# Patient Record
Sex: Female | Born: 1965
Health system: Southern US, Community
[De-identification: ages and names within clinical notes are randomized; demographics above are authoritative.]

## PROBLEM LIST (undated history)

## (undated) DIAGNOSIS — T7840XA Allergy, unspecified, initial encounter: Secondary | ICD-10-CM

## (undated) DIAGNOSIS — Z9889 Other specified postprocedural states: Secondary | ICD-10-CM

## (undated) DIAGNOSIS — D649 Anemia, unspecified: Secondary | ICD-10-CM

## (undated) DIAGNOSIS — J45909 Unspecified asthma, uncomplicated: Secondary | ICD-10-CM

## (undated) DIAGNOSIS — K219 Gastro-esophageal reflux disease without esophagitis: Secondary | ICD-10-CM

## (undated) DIAGNOSIS — M1990 Unspecified osteoarthritis, unspecified site: Secondary | ICD-10-CM

## (undated) DIAGNOSIS — I10 Essential (primary) hypertension: Secondary | ICD-10-CM

## (undated) DIAGNOSIS — H409 Unspecified glaucoma: Secondary | ICD-10-CM

## (undated) DIAGNOSIS — F419 Anxiety disorder, unspecified: Secondary | ICD-10-CM

## (undated) DIAGNOSIS — R112 Nausea with vomiting, unspecified: Secondary | ICD-10-CM

## (undated) HISTORY — DX: Unspecified osteoarthritis, unspecified site: M19.90

## (undated) HISTORY — DX: Anxiety disorder, unspecified: F41.9

## (undated) HISTORY — DX: Unspecified glaucoma: H40.9

## (undated) HISTORY — DX: Allergy, unspecified, initial encounter: T78.40XA

---

## 1990-05-13 HISTORY — PX: NASAL SINUS SURGERY: SHX719

## 1999-07-17 ENCOUNTER — Other Ambulatory Visit: Admission: RE | Admit: 1999-07-17 | Discharge: 1999-07-17 | Payer: Self-pay | Admitting: Unknown Physician Specialty

## 2001-03-19 ENCOUNTER — Ambulatory Visit (HOSPITAL_COMMUNITY): Admission: RE | Admit: 2001-03-19 | Discharge: 2001-03-19 | Payer: Self-pay | Admitting: *Deleted

## 2001-03-19 ENCOUNTER — Encounter: Payer: Self-pay | Admitting: Allergy and Immunology

## 2007-07-10 ENCOUNTER — Emergency Department (HOSPITAL_COMMUNITY): Admission: EM | Admit: 2007-07-10 | Discharge: 2007-07-10 | Payer: Self-pay | Admitting: Family Medicine

## 2007-08-03 ENCOUNTER — Encounter: Admission: RE | Admit: 2007-08-03 | Discharge: 2007-08-03 | Payer: Self-pay | Admitting: Obstetrics and Gynecology

## 2007-09-30 ENCOUNTER — Emergency Department (HOSPITAL_COMMUNITY): Admission: EM | Admit: 2007-09-30 | Discharge: 2007-09-30 | Payer: Self-pay | Admitting: Family Medicine

## 2008-05-13 HISTORY — PX: LIPOMA EXCISION: SHX5283

## 2008-11-07 ENCOUNTER — Encounter: Admission: RE | Admit: 2008-11-07 | Discharge: 2008-11-07 | Payer: Self-pay | Admitting: Obstetrics and Gynecology

## 2011-05-14 HISTORY — PX: CHOLECYSTECTOMY: SHX55

## 2012-05-19 ENCOUNTER — Encounter (HOSPITAL_COMMUNITY): Payer: Self-pay

## 2012-05-19 ENCOUNTER — Other Ambulatory Visit (HOSPITAL_COMMUNITY): Payer: Self-pay | Admitting: Otolaryngology

## 2012-05-19 DIAGNOSIS — R221 Localized swelling, mass and lump, neck: Secondary | ICD-10-CM

## 2012-05-20 ENCOUNTER — Encounter (HOSPITAL_COMMUNITY): Payer: Self-pay | Admitting: Pharmacy Technician

## 2012-05-20 ENCOUNTER — Other Ambulatory Visit: Payer: Self-pay | Admitting: Radiology

## 2012-05-21 ENCOUNTER — Encounter (HOSPITAL_COMMUNITY): Payer: Self-pay

## 2012-05-21 ENCOUNTER — Ambulatory Visit (HOSPITAL_COMMUNITY)
Admission: RE | Admit: 2012-05-21 | Discharge: 2012-05-21 | Disposition: A | Discharge status: Home / Self Care | Payer: Managed Care, Other (non HMO) | Source: Ambulatory Visit | Attending: Unknown Physician Specialty | Admitting: Unknown Physician Specialty

## 2012-05-21 DIAGNOSIS — R221 Localized swelling, mass and lump, neck: Secondary | ICD-10-CM

## 2012-05-21 DIAGNOSIS — R599 Enlarged lymph nodes, unspecified: Secondary | ICD-10-CM | POA: Insufficient documentation

## 2012-05-21 HISTORY — DX: Gastro-esophageal reflux disease without esophagitis: K21.9

## 2012-05-21 HISTORY — DX: Other specified postprocedural states: R11.2

## 2012-05-21 HISTORY — DX: Other specified postprocedural states: Z98.890

## 2012-05-21 HISTORY — DX: Unspecified asthma, uncomplicated: J45.909

## 2012-05-21 HISTORY — DX: Anemia, unspecified: D64.9

## 2012-05-21 LAB — CBC
HCT: 36.2 % (ref 36.0–46.0)
MCV: 77.8 fL — ABNORMAL LOW (ref 78.0–100.0)
RBC: 4.65 MIL/uL (ref 3.87–5.11)
RDW: 15.2 % (ref 11.5–15.5)
WBC: 5.8 10*3/uL (ref 4.0–10.5)

## 2012-05-21 MED ORDER — MIDAZOLAM HCL 2 MG/2ML IJ SOLN
INTRAMUSCULAR | Status: AC | PRN
  Administered 2012-05-21 (×2): 1 mg via INTRAVENOUS
  Administered 2012-05-21: 2 mg via INTRAVENOUS

## 2012-05-21 MED ORDER — MIDAZOLAM HCL 2 MG/2ML IJ SOLN
INTRAMUSCULAR | Status: AC
  Filled 2012-05-21: qty 6

## 2012-05-21 MED ORDER — ONDANSETRON HCL 4 MG/2ML IJ SOLN
INTRAMUSCULAR | Status: AC
  Filled 2012-05-21: qty 2

## 2012-05-21 MED ORDER — SODIUM CHLORIDE 0.9 % IV SOLN
INTRAVENOUS | Status: DC
  Administered 2012-05-21: 12:00:00 via INTRAVENOUS

## 2012-05-21 MED ORDER — FENTANYL CITRATE 0.05 MG/ML IJ SOLN
INTRAMUSCULAR | Status: AC | PRN
  Administered 2012-05-21 (×2): 100 ug via INTRAVENOUS

## 2012-05-21 MED ORDER — ONDANSETRON HCL 4 MG/2ML IJ SOLN
4.0000 mg | Freq: Once | INTRAMUSCULAR | Status: DC
  Filled 2012-05-21: qty 2

## 2012-05-21 MED ORDER — FENTANYL CITRATE 0.05 MG/ML IJ SOLN
INTRAMUSCULAR | Status: AC
  Filled 2012-05-21: qty 6

## 2012-05-21 MED ORDER — ONDANSETRON HCL 4 MG/2ML IJ SOLN
INTRAMUSCULAR | Status: AC | PRN
  Administered 2012-05-21: 4 mg via INTRAVENOUS

## 2012-05-21 NOTE — H&P (Signed)
Chief Complaint: "I'm here for a biopsy" Referring Physician:Dr. Ma HPI: Rebecca Price is an 47 y.o. female who was found to have an enlarged mass/node in her right neck that began about 5 weeks ago. She has been worked up by ENT who has requested US guided biopsy of this mass/node for tissue diagnosis. PMHx and meds reviewed.  Past Medical History:  Past Medical History  Diagnosis Date  . GERD (gastroesophageal reflux disease)   . Anemia     takes B 12 injection  . Asthma     allergy induced asthma  . PONV (postoperative nausea and vomiting)     Past Surgical History:  Past Surgical History  Procedure Date  . Nasal sinus surgery 1992  . Lipoma excision 2010    left arm  . Cholecystectomy 2013    Family History: No family history on file.  Social History:  reports that she quit smoking about 24 years ago. She does not have any smokeless tobacco history on file. She reports that she does not drink alcohol or use illicit drugs.  Allergies:  Allergies  Allergen Reactions  . Tape Other (See Comments)    transpore tape pulls my skin off. Bandaides makes my skin red where sticky part touches me.  . Singulair (Montelukast) Palpitations and Other (See Comments)    Irregular heart beats, palpitations      Medications: acyclovir (ZOVIRAX) 400 MG tablet (Taking) Sig - Route: Take 400 mg by mouth daily as needed. For fever blister - Oral Class: Historical Med aspirin-acetaminophen-caffeine (EXCEDRIN MIGRAINE) 250-250-65 MG per tablet (Taking) Sig - Route: Take 1 tablet by mouth every 6 (six) hours as needed. For headaches - Oral Class: Historical Med cyanocobalamin (,VITAMIN B-12,) 1000 MCG/ML injection (Taking) Sig - Route: Inject 1,000 mcg into the muscle every 30 (thirty) days. - Intramuscular Class: Historical Med latanoprost (XALATAN) 0.005 % ophthalmic solution (Taking) Sig - Route: Place 1 drop into both eyes at bedtime. - Both Eyes Class: Historical Med omeprazole (PRILOSEC) 20  MG capsule (Taking) Sig - Route: Take 20 mg by mouth daily   Please HPI for pertinent positives, otherwise complete 10 system ROS negative.  Physical Exam: Blood pressure 134/92, pulse 83, temperature 98.5 F (36.9 C), temperature source Oral, resp. rate 18, height 5\' 6"  (1.676 m), weight 212 lb (96.163 kg), last menstrual period 05/16/2012, SpO2 100.00%. Body mass index is 34.22 kg/(m^2).   General Appearance:  Alert, cooperative, no distress, appears stated age  Head:  Normocephalic, without obvious abnormality, atraumatic  ENT: Unremarkable  Neck: Supple, symmetrical, trachea midline. Palpable NT mass(es) right submental/submandibular area, firm, nonmobile.  Lungs:   Clear to auscultation bilaterally, no w/r/r, respirations unlabored without use of accessory muscles.  Chest Wall:  No tenderness or deformity  Heart:  Regular rate and rhythm, S1, S2 normal, no murmur, rub or gallop. Carotids 2+ without bruit.  Abdomen:   Soft, non-tender, non distended. Bowel sounds active all four quadrants,  no masses, no organomegaly.  Neurologic: Normal affect, no gross deficits.   Results for orders placed during the hospital encounter of 05/21/12 (from the past 48 hour(s))  APTT     Status: Normal   Collection Time   05/21/12 11:13 AM      Component Value Range Comment   aPTT 28  24 - 37 seconds   CBC     Status: Abnormal   Collection Time   05/21/12 11:13 AM      Component Value Range Comment  WBC 5.8  4.0 - 10.5 K/uL    RBC 4.65  3.87 - 5.11 MIL/uL    Hemoglobin 11.8 (*) 12.0 - 15.0 g/dL    HCT 16.1  09.6 - 04.5 %    MCV 77.8 (*) 78.0 - 100.0 fL    MCH 25.4 (*) 26.0 - 34.0 pg    MCHC 32.6  30.0 - 36.0 g/dL    RDW 40.9  81.1 - 91.4 %    Platelets 223  150 - 400 K/uL   PROTIME-INR     Status: Normal   Collection Time   05/21/12 11:13 AM      Component Value Range Comment   Prothrombin Time 12.9  11.6 - 15.2 seconds    INR 0.98  0.00 - 1.49    No results found.  Assessment/Plan (R)  neck masses/nodes of uncertain etiology Discussed US guided biopsy today Explained procedure, risks, complications, sedation use/risks. Labs reviewed, ok Consent signed in chart  Brayton El PA-C 05/21/2012, 12:08 PM

## 2012-05-21 NOTE — H&P (Signed)
Agree 

## 2012-05-21 NOTE — Procedures (Signed)
Procedure:  Ultrasound guided core biopsy of right submandibular lymph node Findings:  18 G core bx x 6 of right submandibular lymph node

## 2012-05-26 ENCOUNTER — Other Ambulatory Visit (HOSPITAL_COMMUNITY): Payer: Self-pay

## 2013-01-21 ENCOUNTER — Other Ambulatory Visit: Payer: Self-pay | Admitting: Internal Medicine

## 2013-01-21 DIAGNOSIS — E041 Nontoxic single thyroid nodule: Secondary | ICD-10-CM

## 2013-01-28 ENCOUNTER — Ambulatory Visit
Admission: RE | Admit: 2013-01-28 | Discharge: 2013-01-28 | Disposition: A | Discharge status: Home / Self Care | Payer: Managed Care, Other (non HMO) | Source: Ambulatory Visit | Attending: Internal Medicine | Admitting: Internal Medicine

## 2013-01-28 ENCOUNTER — Other Ambulatory Visit (HOSPITAL_COMMUNITY)
Admission: RE | Admit: 2013-01-28 | Discharge: 2013-01-28 | Disposition: A | Discharge status: Home / Self Care | Payer: Managed Care, Other (non HMO) | Source: Ambulatory Visit | Attending: Interventional Radiology | Admitting: Interventional Radiology

## 2013-01-28 DIAGNOSIS — E041 Nontoxic single thyroid nodule: Secondary | ICD-10-CM

## 2013-02-03 ENCOUNTER — Other Ambulatory Visit: Payer: Managed Care, Other (non HMO)

## 2016-05-20 DIAGNOSIS — R109 Unspecified abdominal pain: Secondary | ICD-10-CM | POA: Diagnosis not present

## 2016-05-20 DIAGNOSIS — N390 Urinary tract infection, site not specified: Secondary | ICD-10-CM | POA: Diagnosis not present

## 2016-09-18 DIAGNOSIS — R079 Chest pain, unspecified: Secondary | ICD-10-CM | POA: Diagnosis not present

## 2016-10-24 DIAGNOSIS — N926 Irregular menstruation, unspecified: Secondary | ICD-10-CM | POA: Diagnosis not present

## 2016-10-24 DIAGNOSIS — Z79899 Other long term (current) drug therapy: Secondary | ICD-10-CM | POA: Diagnosis not present

## 2016-10-24 DIAGNOSIS — R232 Flushing: Secondary | ICD-10-CM | POA: Diagnosis not present

## 2016-10-24 DIAGNOSIS — R030 Elevated blood-pressure reading, without diagnosis of hypertension: Secondary | ICD-10-CM | POA: Diagnosis not present

## 2016-10-24 DIAGNOSIS — D509 Iron deficiency anemia, unspecified: Secondary | ICD-10-CM | POA: Diagnosis not present

## 2016-11-15 DIAGNOSIS — J45909 Unspecified asthma, uncomplicated: Secondary | ICD-10-CM | POA: Diagnosis not present

## 2016-11-15 DIAGNOSIS — R21 Rash and other nonspecific skin eruption: Secondary | ICD-10-CM | POA: Diagnosis not present

## 2016-11-15 DIAGNOSIS — R509 Fever, unspecified: Secondary | ICD-10-CM | POA: Diagnosis not present

## 2017-02-04 DIAGNOSIS — L578 Other skin changes due to chronic exposure to nonionizing radiation: Secondary | ICD-10-CM | POA: Diagnosis not present

## 2017-02-04 DIAGNOSIS — D225 Melanocytic nevi of trunk: Secondary | ICD-10-CM | POA: Diagnosis not present

## 2017-02-04 DIAGNOSIS — D485 Neoplasm of uncertain behavior of skin: Secondary | ICD-10-CM | POA: Diagnosis not present

## 2017-02-04 DIAGNOSIS — L820 Inflamed seborrheic keratosis: Secondary | ICD-10-CM | POA: Diagnosis not present

## 2017-02-10 DIAGNOSIS — H47233 Glaucomatous optic atrophy, bilateral: Secondary | ICD-10-CM | POA: Diagnosis not present

## 2017-02-10 DIAGNOSIS — H401131 Primary open-angle glaucoma, bilateral, mild stage: Secondary | ICD-10-CM | POA: Diagnosis not present

## 2017-03-18 DIAGNOSIS — M549 Dorsalgia, unspecified: Secondary | ICD-10-CM | POA: Diagnosis not present

## 2017-03-18 DIAGNOSIS — I10 Essential (primary) hypertension: Secondary | ICD-10-CM | POA: Diagnosis not present

## 2017-03-18 DIAGNOSIS — G43009 Migraine without aura, not intractable, without status migrainosus: Secondary | ICD-10-CM | POA: Diagnosis not present

## 2017-03-24 DIAGNOSIS — M549 Dorsalgia, unspecified: Secondary | ICD-10-CM | POA: Diagnosis not present

## 2017-03-24 DIAGNOSIS — M545 Low back pain: Secondary | ICD-10-CM | POA: Diagnosis not present

## 2017-04-22 DIAGNOSIS — E559 Vitamin D deficiency, unspecified: Secondary | ICD-10-CM | POA: Diagnosis not present

## 2017-04-22 DIAGNOSIS — K219 Gastro-esophageal reflux disease without esophagitis: Secondary | ICD-10-CM | POA: Diagnosis not present

## 2017-04-22 DIAGNOSIS — I10 Essential (primary) hypertension: Secondary | ICD-10-CM | POA: Diagnosis not present

## 2017-04-22 DIAGNOSIS — D509 Iron deficiency anemia, unspecified: Secondary | ICD-10-CM | POA: Diagnosis not present

## 2017-05-27 DIAGNOSIS — R062 Wheezing: Secondary | ICD-10-CM | POA: Diagnosis not present

## 2017-05-27 DIAGNOSIS — R05 Cough: Secondary | ICD-10-CM | POA: Diagnosis not present

## 2017-05-27 DIAGNOSIS — R198 Other specified symptoms and signs involving the digestive system and abdomen: Secondary | ICD-10-CM | POA: Diagnosis not present

## 2017-06-03 DIAGNOSIS — H47233 Glaucomatous optic atrophy, bilateral: Secondary | ICD-10-CM | POA: Diagnosis not present

## 2017-06-03 DIAGNOSIS — H401131 Primary open-angle glaucoma, bilateral, mild stage: Secondary | ICD-10-CM | POA: Diagnosis not present

## 2017-07-23 DIAGNOSIS — M545 Low back pain: Secondary | ICD-10-CM | POA: Diagnosis not present

## 2017-08-19 DIAGNOSIS — F419 Anxiety disorder, unspecified: Secondary | ICD-10-CM | POA: Diagnosis not present

## 2017-08-19 DIAGNOSIS — F331 Major depressive disorder, recurrent, moderate: Secondary | ICD-10-CM | POA: Diagnosis not present

## 2017-08-19 DIAGNOSIS — I10 Essential (primary) hypertension: Secondary | ICD-10-CM | POA: Diagnosis not present

## 2017-08-19 DIAGNOSIS — K219 Gastro-esophageal reflux disease without esophagitis: Secondary | ICD-10-CM | POA: Diagnosis not present

## 2017-09-24 DIAGNOSIS — F329 Major depressive disorder, single episode, unspecified: Secondary | ICD-10-CM | POA: Diagnosis not present

## 2017-09-24 DIAGNOSIS — F419 Anxiety disorder, unspecified: Secondary | ICD-10-CM | POA: Diagnosis not present

## 2017-10-02 DIAGNOSIS — F419 Anxiety disorder, unspecified: Secondary | ICD-10-CM | POA: Diagnosis not present

## 2017-10-02 DIAGNOSIS — F329 Major depressive disorder, single episode, unspecified: Secondary | ICD-10-CM | POA: Diagnosis not present

## 2017-10-07 DIAGNOSIS — M79671 Pain in right foot: Secondary | ICD-10-CM | POA: Diagnosis not present

## 2017-10-07 DIAGNOSIS — G43009 Migraine without aura, not intractable, without status migrainosus: Secondary | ICD-10-CM | POA: Diagnosis not present

## 2017-10-07 DIAGNOSIS — I10 Essential (primary) hypertension: Secondary | ICD-10-CM | POA: Diagnosis not present

## 2017-10-07 DIAGNOSIS — J4520 Mild intermittent asthma, uncomplicated: Secondary | ICD-10-CM | POA: Diagnosis not present

## 2017-10-08 DIAGNOSIS — F329 Major depressive disorder, single episode, unspecified: Secondary | ICD-10-CM | POA: Diagnosis not present

## 2017-10-08 DIAGNOSIS — F419 Anxiety disorder, unspecified: Secondary | ICD-10-CM | POA: Diagnosis not present

## 2017-10-15 DIAGNOSIS — F329 Major depressive disorder, single episode, unspecified: Secondary | ICD-10-CM | POA: Diagnosis not present

## 2017-10-15 DIAGNOSIS — F419 Anxiety disorder, unspecified: Secondary | ICD-10-CM | POA: Diagnosis not present

## 2017-10-30 DIAGNOSIS — F329 Major depressive disorder, single episode, unspecified: Secondary | ICD-10-CM | POA: Diagnosis not present

## 2017-10-30 DIAGNOSIS — F419 Anxiety disorder, unspecified: Secondary | ICD-10-CM | POA: Diagnosis not present

## 2017-11-05 DIAGNOSIS — F329 Major depressive disorder, single episode, unspecified: Secondary | ICD-10-CM | POA: Diagnosis not present

## 2017-11-05 DIAGNOSIS — F419 Anxiety disorder, unspecified: Secondary | ICD-10-CM | POA: Diagnosis not present

## 2017-11-20 DIAGNOSIS — F329 Major depressive disorder, single episode, unspecified: Secondary | ICD-10-CM | POA: Diagnosis not present

## 2017-11-20 DIAGNOSIS — F419 Anxiety disorder, unspecified: Secondary | ICD-10-CM | POA: Diagnosis not present

## 2017-12-03 DIAGNOSIS — N926 Irregular menstruation, unspecified: Secondary | ICD-10-CM | POA: Diagnosis not present

## 2017-12-03 DIAGNOSIS — L659 Nonscarring hair loss, unspecified: Secondary | ICD-10-CM | POA: Diagnosis not present

## 2017-12-03 DIAGNOSIS — F329 Major depressive disorder, single episode, unspecified: Secondary | ICD-10-CM | POA: Diagnosis not present

## 2017-12-03 DIAGNOSIS — F419 Anxiety disorder, unspecified: Secondary | ICD-10-CM | POA: Diagnosis not present

## 2017-12-03 DIAGNOSIS — D509 Iron deficiency anemia, unspecified: Secondary | ICD-10-CM | POA: Diagnosis not present

## 2017-12-03 DIAGNOSIS — Z79899 Other long term (current) drug therapy: Secondary | ICD-10-CM | POA: Diagnosis not present

## 2017-12-05 DIAGNOSIS — H524 Presbyopia: Secondary | ICD-10-CM | POA: Diagnosis not present

## 2017-12-05 DIAGNOSIS — H401131 Primary open-angle glaucoma, bilateral, mild stage: Secondary | ICD-10-CM | POA: Diagnosis not present

## 2017-12-10 DIAGNOSIS — R109 Unspecified abdominal pain: Secondary | ICD-10-CM | POA: Diagnosis not present

## 2017-12-18 DIAGNOSIS — F329 Major depressive disorder, single episode, unspecified: Secondary | ICD-10-CM | POA: Diagnosis not present

## 2017-12-18 DIAGNOSIS — F419 Anxiety disorder, unspecified: Secondary | ICD-10-CM | POA: Diagnosis not present

## 2018-01-01 DIAGNOSIS — F329 Major depressive disorder, single episode, unspecified: Secondary | ICD-10-CM | POA: Diagnosis not present

## 2018-01-01 DIAGNOSIS — F419 Anxiety disorder, unspecified: Secondary | ICD-10-CM | POA: Diagnosis not present

## 2018-01-21 DIAGNOSIS — F419 Anxiety disorder, unspecified: Secondary | ICD-10-CM | POA: Diagnosis not present

## 2018-01-21 DIAGNOSIS — F329 Major depressive disorder, single episode, unspecified: Secondary | ICD-10-CM | POA: Diagnosis not present

## 2018-02-03 DIAGNOSIS — F329 Major depressive disorder, single episode, unspecified: Secondary | ICD-10-CM | POA: Diagnosis not present

## 2018-02-03 DIAGNOSIS — F419 Anxiety disorder, unspecified: Secondary | ICD-10-CM | POA: Diagnosis not present

## 2018-02-09 DIAGNOSIS — Z6837 Body mass index (BMI) 37.0-37.9, adult: Secondary | ICD-10-CM | POA: Diagnosis not present

## 2018-02-09 DIAGNOSIS — J069 Acute upper respiratory infection, unspecified: Secondary | ICD-10-CM | POA: Diagnosis not present

## 2018-02-09 DIAGNOSIS — R05 Cough: Secondary | ICD-10-CM | POA: Diagnosis not present

## 2018-02-09 DIAGNOSIS — R062 Wheezing: Secondary | ICD-10-CM | POA: Diagnosis not present

## 2018-02-11 DIAGNOSIS — F329 Major depressive disorder, single episode, unspecified: Secondary | ICD-10-CM | POA: Diagnosis not present

## 2018-02-11 DIAGNOSIS — F419 Anxiety disorder, unspecified: Secondary | ICD-10-CM | POA: Diagnosis not present

## 2018-03-05 DIAGNOSIS — F329 Major depressive disorder, single episode, unspecified: Secondary | ICD-10-CM | POA: Diagnosis not present

## 2018-03-05 DIAGNOSIS — F419 Anxiety disorder, unspecified: Secondary | ICD-10-CM | POA: Diagnosis not present

## 2018-03-06 DIAGNOSIS — J4520 Mild intermittent asthma, uncomplicated: Secondary | ICD-10-CM | POA: Diagnosis not present

## 2018-03-06 DIAGNOSIS — I10 Essential (primary) hypertension: Secondary | ICD-10-CM | POA: Diagnosis not present

## 2018-03-06 DIAGNOSIS — F419 Anxiety disorder, unspecified: Secondary | ICD-10-CM | POA: Diagnosis not present

## 2018-03-06 DIAGNOSIS — G43009 Migraine without aura, not intractable, without status migrainosus: Secondary | ICD-10-CM | POA: Diagnosis not present

## 2018-03-12 DIAGNOSIS — H401131 Primary open-angle glaucoma, bilateral, mild stage: Secondary | ICD-10-CM | POA: Diagnosis not present

## 2018-03-18 DIAGNOSIS — F419 Anxiety disorder, unspecified: Secondary | ICD-10-CM | POA: Diagnosis not present

## 2018-03-18 DIAGNOSIS — F329 Major depressive disorder, single episode, unspecified: Secondary | ICD-10-CM | POA: Diagnosis not present

## 2018-03-31 DIAGNOSIS — F329 Major depressive disorder, single episode, unspecified: Secondary | ICD-10-CM | POA: Diagnosis not present

## 2018-03-31 DIAGNOSIS — F419 Anxiety disorder, unspecified: Secondary | ICD-10-CM | POA: Diagnosis not present

## 2018-04-22 DIAGNOSIS — F329 Major depressive disorder, single episode, unspecified: Secondary | ICD-10-CM | POA: Diagnosis not present

## 2018-04-22 DIAGNOSIS — F419 Anxiety disorder, unspecified: Secondary | ICD-10-CM | POA: Diagnosis not present

## 2018-05-15 DIAGNOSIS — F329 Major depressive disorder, single episode, unspecified: Secondary | ICD-10-CM | POA: Diagnosis not present

## 2018-05-15 DIAGNOSIS — F419 Anxiety disorder, unspecified: Secondary | ICD-10-CM | POA: Diagnosis not present

## 2018-05-26 DIAGNOSIS — M542 Cervicalgia: Secondary | ICD-10-CM | POA: Diagnosis not present

## 2018-05-26 DIAGNOSIS — I10 Essential (primary) hypertension: Secondary | ICD-10-CM | POA: Diagnosis not present

## 2018-05-26 DIAGNOSIS — D509 Iron deficiency anemia, unspecified: Secondary | ICD-10-CM | POA: Diagnosis not present

## 2018-05-26 DIAGNOSIS — R209 Unspecified disturbances of skin sensation: Secondary | ICD-10-CM | POA: Diagnosis not present

## 2018-05-26 DIAGNOSIS — Z79899 Other long term (current) drug therapy: Secondary | ICD-10-CM | POA: Diagnosis not present

## 2018-05-29 DIAGNOSIS — F419 Anxiety disorder, unspecified: Secondary | ICD-10-CM | POA: Diagnosis not present

## 2018-05-29 DIAGNOSIS — F329 Major depressive disorder, single episode, unspecified: Secondary | ICD-10-CM | POA: Diagnosis not present

## 2018-06-04 DIAGNOSIS — R7989 Other specified abnormal findings of blood chemistry: Secondary | ICD-10-CM | POA: Diagnosis not present

## 2018-06-09 DIAGNOSIS — F329 Major depressive disorder, single episode, unspecified: Secondary | ICD-10-CM | POA: Diagnosis not present

## 2018-06-09 DIAGNOSIS — F419 Anxiety disorder, unspecified: Secondary | ICD-10-CM | POA: Diagnosis not present

## 2018-06-10 ENCOUNTER — Other Ambulatory Visit: Payer: Self-pay | Admitting: Internal Medicine

## 2018-06-10 DIAGNOSIS — R209 Unspecified disturbances of skin sensation: Secondary | ICD-10-CM

## 2018-06-10 DIAGNOSIS — M542 Cervicalgia: Secondary | ICD-10-CM

## 2018-06-17 DIAGNOSIS — J329 Chronic sinusitis, unspecified: Secondary | ICD-10-CM | POA: Diagnosis not present

## 2018-06-17 DIAGNOSIS — R05 Cough: Secondary | ICD-10-CM | POA: Diagnosis not present

## 2018-06-17 DIAGNOSIS — Z6837 Body mass index (BMI) 37.0-37.9, adult: Secondary | ICD-10-CM | POA: Diagnosis not present

## 2018-06-17 DIAGNOSIS — J029 Acute pharyngitis, unspecified: Secondary | ICD-10-CM | POA: Diagnosis not present

## 2018-06-22 ENCOUNTER — Ambulatory Visit
Admission: RE | Admit: 2018-06-22 | Discharge: 2018-06-22 | Disposition: A | Discharge status: Home / Self Care | Payer: BLUE CROSS/BLUE SHIELD | Source: Ambulatory Visit | Attending: Internal Medicine | Admitting: Internal Medicine

## 2018-06-22 DIAGNOSIS — R209 Unspecified disturbances of skin sensation: Secondary | ICD-10-CM

## 2018-06-22 DIAGNOSIS — M542 Cervicalgia: Secondary | ICD-10-CM

## 2018-06-24 DIAGNOSIS — H401131 Primary open-angle glaucoma, bilateral, mild stage: Secondary | ICD-10-CM | POA: Diagnosis not present

## 2018-07-02 DIAGNOSIS — F419 Anxiety disorder, unspecified: Secondary | ICD-10-CM | POA: Diagnosis not present

## 2018-07-02 DIAGNOSIS — F329 Major depressive disorder, single episode, unspecified: Secondary | ICD-10-CM | POA: Diagnosis not present

## 2018-07-16 DIAGNOSIS — F419 Anxiety disorder, unspecified: Secondary | ICD-10-CM | POA: Diagnosis not present

## 2018-07-16 DIAGNOSIS — F329 Major depressive disorder, single episode, unspecified: Secondary | ICD-10-CM | POA: Diagnosis not present

## 2018-08-06 DIAGNOSIS — J4520 Mild intermittent asthma, uncomplicated: Secondary | ICD-10-CM | POA: Diagnosis not present

## 2018-08-06 DIAGNOSIS — F419 Anxiety disorder, unspecified: Secondary | ICD-10-CM | POA: Diagnosis not present

## 2018-08-06 DIAGNOSIS — M25512 Pain in left shoulder: Secondary | ICD-10-CM | POA: Diagnosis not present

## 2018-08-06 DIAGNOSIS — F329 Major depressive disorder, single episode, unspecified: Secondary | ICD-10-CM | POA: Diagnosis not present

## 2018-08-06 DIAGNOSIS — J302 Other seasonal allergic rhinitis: Secondary | ICD-10-CM | POA: Diagnosis not present

## 2018-08-06 DIAGNOSIS — I10 Essential (primary) hypertension: Secondary | ICD-10-CM | POA: Diagnosis not present

## 2018-08-20 DIAGNOSIS — F329 Major depressive disorder, single episode, unspecified: Secondary | ICD-10-CM | POA: Diagnosis not present

## 2018-08-20 DIAGNOSIS — F419 Anxiety disorder, unspecified: Secondary | ICD-10-CM | POA: Diagnosis not present

## 2018-09-03 DIAGNOSIS — F329 Major depressive disorder, single episode, unspecified: Secondary | ICD-10-CM | POA: Diagnosis not present

## 2018-09-03 DIAGNOSIS — F419 Anxiety disorder, unspecified: Secondary | ICD-10-CM | POA: Diagnosis not present

## 2018-09-17 DIAGNOSIS — F329 Major depressive disorder, single episode, unspecified: Secondary | ICD-10-CM | POA: Diagnosis not present

## 2018-09-17 DIAGNOSIS — F419 Anxiety disorder, unspecified: Secondary | ICD-10-CM | POA: Diagnosis not present

## 2018-10-08 DIAGNOSIS — F419 Anxiety disorder, unspecified: Secondary | ICD-10-CM | POA: Diagnosis not present

## 2018-10-08 DIAGNOSIS — F329 Major depressive disorder, single episode, unspecified: Secondary | ICD-10-CM | POA: Diagnosis not present

## 2018-10-22 DIAGNOSIS — Z6837 Body mass index (BMI) 37.0-37.9, adult: Secondary | ICD-10-CM | POA: Diagnosis not present

## 2018-10-22 DIAGNOSIS — S91351A Open bite, right foot, initial encounter: Secondary | ICD-10-CM | POA: Diagnosis not present

## 2018-10-22 DIAGNOSIS — Z23 Encounter for immunization: Secondary | ICD-10-CM | POA: Diagnosis not present

## 2018-10-22 DIAGNOSIS — W540XXA Bitten by dog, initial encounter: Secondary | ICD-10-CM | POA: Diagnosis not present

## 2018-10-28 DIAGNOSIS — F419 Anxiety disorder, unspecified: Secondary | ICD-10-CM | POA: Diagnosis not present

## 2018-10-28 DIAGNOSIS — F329 Major depressive disorder, single episode, unspecified: Secondary | ICD-10-CM | POA: Diagnosis not present

## 2018-10-28 DIAGNOSIS — Z20828 Contact with and (suspected) exposure to other viral communicable diseases: Secondary | ICD-10-CM | POA: Diagnosis not present

## 2018-11-24 DIAGNOSIS — F419 Anxiety disorder, unspecified: Secondary | ICD-10-CM | POA: Diagnosis not present

## 2018-11-24 DIAGNOSIS — F329 Major depressive disorder, single episode, unspecified: Secondary | ICD-10-CM | POA: Diagnosis not present

## 2018-12-08 DIAGNOSIS — K219 Gastro-esophageal reflux disease without esophagitis: Secondary | ICD-10-CM | POA: Diagnosis not present

## 2018-12-08 DIAGNOSIS — I10 Essential (primary) hypertension: Secondary | ICD-10-CM | POA: Diagnosis not present

## 2018-12-08 DIAGNOSIS — D509 Iron deficiency anemia, unspecified: Secondary | ICD-10-CM | POA: Diagnosis not present

## 2018-12-08 DIAGNOSIS — F419 Anxiety disorder, unspecified: Secondary | ICD-10-CM | POA: Diagnosis not present

## 2018-12-08 DIAGNOSIS — Z79899 Other long term (current) drug therapy: Secondary | ICD-10-CM | POA: Diagnosis not present

## 2018-12-10 DIAGNOSIS — H401131 Primary open-angle glaucoma, bilateral, mild stage: Secondary | ICD-10-CM | POA: Diagnosis not present

## 2018-12-22 DIAGNOSIS — F329 Major depressive disorder, single episode, unspecified: Secondary | ICD-10-CM | POA: Diagnosis not present

## 2018-12-22 DIAGNOSIS — F419 Anxiety disorder, unspecified: Secondary | ICD-10-CM | POA: Diagnosis not present

## 2019-01-26 DIAGNOSIS — J3089 Other allergic rhinitis: Secondary | ICD-10-CM | POA: Diagnosis not present

## 2019-01-26 DIAGNOSIS — J3081 Allergic rhinitis due to animal (cat) (dog) hair and dander: Secondary | ICD-10-CM | POA: Diagnosis not present

## 2019-01-26 DIAGNOSIS — H1045 Other chronic allergic conjunctivitis: Secondary | ICD-10-CM | POA: Diagnosis not present

## 2019-01-26 DIAGNOSIS — R05 Cough: Secondary | ICD-10-CM | POA: Diagnosis not present

## 2019-02-08 DIAGNOSIS — Z20828 Contact with and (suspected) exposure to other viral communicable diseases: Secondary | ICD-10-CM | POA: Diagnosis not present

## 2019-03-15 DIAGNOSIS — H401131 Primary open-angle glaucoma, bilateral, mild stage: Secondary | ICD-10-CM | POA: Diagnosis not present

## 2019-03-30 DIAGNOSIS — Z20828 Contact with and (suspected) exposure to other viral communicable diseases: Secondary | ICD-10-CM | POA: Diagnosis not present

## 2019-04-24 DIAGNOSIS — Z1159 Encounter for screening for other viral diseases: Secondary | ICD-10-CM | POA: Diagnosis not present

## 2019-04-24 DIAGNOSIS — Z20828 Contact with and (suspected) exposure to other viral communicable diseases: Secondary | ICD-10-CM | POA: Diagnosis not present

## 2019-04-29 DIAGNOSIS — Z119 Encounter for screening for infectious and parasitic diseases, unspecified: Secondary | ICD-10-CM | POA: Diagnosis not present

## 2019-07-30 DIAGNOSIS — I10 Essential (primary) hypertension: Secondary | ICD-10-CM | POA: Diagnosis not present

## 2019-07-30 DIAGNOSIS — Z79899 Other long term (current) drug therapy: Secondary | ICD-10-CM | POA: Diagnosis not present

## 2019-09-06 ENCOUNTER — Emergency Department
Admission: EM | Admit: 2019-09-06 | Discharge: 2019-09-06 | Disposition: A | Discharge status: Home / Self Care | Payer: 59 | Source: Home / Self Care

## 2019-09-06 ENCOUNTER — Other Ambulatory Visit: Payer: Self-pay

## 2019-09-06 ENCOUNTER — Emergency Department (INDEPENDENT_AMBULATORY_CARE_PROVIDER_SITE_OTHER): Payer: 59

## 2019-09-06 ENCOUNTER — Encounter: Payer: Self-pay | Admitting: Emergency Medicine

## 2019-09-06 DIAGNOSIS — R1031 Right lower quadrant pain: Secondary | ICD-10-CM

## 2019-09-06 DIAGNOSIS — M25551 Pain in right hip: Secondary | ICD-10-CM

## 2019-09-06 DIAGNOSIS — M545 Low back pain, unspecified: Secondary | ICD-10-CM

## 2019-09-06 HISTORY — DX: Essential (primary) hypertension: I10

## 2019-09-06 NOTE — ED Provider Notes (Signed)
[facility]    CSN: [id number] Arrival date & time: 09/06/19  1546      History   Chief Complaint Chief Complaint  Patient presents with  . Back Pain    left lower    HPI Rebecca Price is a 54 y.o. female.   HPI Rebecca Price is a 54 y.o. female presenting to UC with hx of intermittent low back pain c/o 11 days of Right side lower back pain that is radiating into her Right hip and groin. Pain started after hiking and flying to and from [place] about 2 weeks ago. No known injury but she notes on the flight home she was sitting towards her Left side due to her Right side already feeling uncomfortable.  Pain is a constant ache, 0000000, worse with certain movements. Pain is more persistent than her usual back pain, which is concerning for pt.  She is requesting imaging today to see what is causing her persistent pain.  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  She does report hx of neck problems in the past and has had PT for that but has not f/u for current Right sided pain.  She has tried OTC Tylenol, ibuprofen, and salon pas patches w/o relief.  Denies weakness or numbness in arms or legs. No change in bowel or bladder habits   Past Medical History:  Diagnosis Date  . Anemia    takes B 12 injection  . Asthma    allergy induced asthma  . GERD (gastroesophageal reflux disease)   . Hypertension   . PONV (postoperative nausea and vomiting)     There are no problems to display for this patient.   Past Surgical History:  Procedure Laterality Date  . CHOLECYSTECTOMY  2013  . LIPOMA EXCISION  2010   left arm  . NASAL SINUS SURGERY  1992    OB History   No obstetric history on file.      Home Medications    Prior to Admission medications   Medication Sig Start Date End Date Taking? Authorizing Provider  aspirin-acetaminophen-caffeine (EXCEDRIN MIGRAINE) [phone] MG per tablet Take 1 tablet by mouth  every 6 (six) hours as needed. For headaches   Yes [provider]  FLOVENT DISKUS 100 MCG/BLIST AEPB Take 1 puff by mouth 2 (two) times daily. 08/09/19  Yes [provider]  FLUoxetine (PROZAC) 40 MG capsule Take 40 mg by mouth every morning. 09/03/19  Yes [provider]  Iron-FA-B Cmp-C-Biot-Probiotic (FUSION PLUS) CAPS Take 1 capsule by mouth daily. 09/02/19  Yes [provider]  latanoprost (XALATAN) 0.005 % ophthalmic solution Place 1 drop into both eyes at bedtime.   Yes [provider]  losartan (COZAAR) 100 MG tablet Take 100 mg by mouth daily. 09/03/19  Yes [provider]  omeprazole (PRILOSEC) 20 MG capsule Take 40 mg by mouth daily.    Yes [provider]  acyclovir (ZOVIRAX) 400 MG tablet Take 400 mg by mouth daily as needed. For fever blister    [provider]  buPROPion (WELLBUTRIN XL) 150 MG 24 hr tablet Take 150 mg by mouth every morning. 09/03/19   [provider]  cyanocobalamin (,VITAMIN B-12,) 1000 MCG/ML injection Inject 1,000 mcg into the muscle every 30 (thirty) days.    [provider]    Family History Family History  Problem Relation Age of Onset  . Stroke  Mother   . Diabetes Father   . Cancer Brother     Social History Social History   Tobacco Use  . Smoking status: Former Smoker    Packs/day: 0.25    Years: 5.00    Pack years: 1.25    Quit date: 05/21/1988    Years since quitting: 31.3  . Smokeless tobacco: Never Used  Substance Use Topics  . Alcohol use: No  . Drug use: No     Allergies   Tape and Singulair [montelukast]   Review of Systems Review of Systems  Genitourinary: Negative for dysuria, flank pain and hematuria.  Musculoskeletal: Positive for back pain and myalgias. Negative for neck pain and neck stiffness.  Neurological: Negative for weakness and numbness.     Physical Exam Triage Vital Signs ED Triage Vitals  Enc Vitals Group     BP  09/06/19 1603 (!) 145/90     Pulse Rate 09/06/19 1603 82     Resp 09/06/19 1603 16     Temp 09/06/19 1603 99.2 F (37.3 C)     Temp Source 09/06/19 1603 Oral     SpO2 09/06/19 1603 97 %     Weight --      Height --      Head Circumference --      Peak Flow --      Pain Score 09/06/19 1607 5     Pain Loc --      Pain Edu? --      Excl. in [place]? --    No data found.  Updated Vital Signs BP (!) 145/90 (BP Location: Right Arm)   Pulse 82   Temp 99.2 F (37.3 C) (Oral)   Resp 16   LMP 07/31/2019   SpO2 97%   Visual Acuity Right Eye Distance:   Left Eye Distance:   Bilateral Distance:    Right Eye Near:   Left Eye Near:    Bilateral Near:     Physical Exam Vitals and nursing note reviewed.  Constitutional:      Appearance: Normal appearance. She is well-developed.  HENT:     Head: Normocephalic and atraumatic.  Neck:     Comments: No midline bone tenderness, no crepitus or step-offs.  Cardiovascular:     Rate and Rhythm: Normal rate.  Pulmonary:     Effort: Pulmonary effort is normal.  Musculoskeletal:        General: Tenderness present. Normal range of motion.     Cervical back: Normal range of motion and neck supple.       Back:     Comments: Normal gait. Full ROM upper and lower extremities with 5/5 strength.  Skin:    General: Skin is warm and dry.  Neurological:     Mental Status: She is alert and oriented to person, place, and time.  Psychiatric:        Behavior: Behavior normal.      UC Treatments / Results  Labs (all labs ordered are listed, but only abnormal results are displayed) Labs Reviewed - No data to display  EKG   Radiology DG Lumbar Spine Complete  Result Date: 09/06/2019 CLINICAL DATA:  54 year-old female c/o RIGHT lower back pain that radiates into her right hip x 2 weeks. NKI. No sx. EXAM: LUMBAR SPINE - COMPLETE 4+ VIEW; DG HIP (WITH OR WITHOUT PELVIS) 2-3V RIGHT COMPARISON:  None. FINDINGS: Lumbar spine: Normal alignment.  Vertebral body heights and intervertebral disc spaces are maintained. There is  mild multilevel anterior spurring. No focal bone lesion. SI joints are open. Nonobstructive bowel gas pattern. Surgical clips are noted in the right upper quadrant. Right hip: Normal right hip alignment. There is no evidence of fracture or dislocation. There is no focal bone lesion. Hip joint spaces well maintained. There is no acute finding in the remainder of the pelvis. Regional soft tissues are unremarkable. IMPRESSION: No acute osseous abnormality in the lumbar spine or right hip. Electronically Signed   By: Audie Pinto M.D.   On: 09/06/2019 16:50   DG Hip Unilat W or Wo Pelvis 2-3 Views Right  Result Date: 09/06/2019 CLINICAL DATA:  54 year-old female c/o RIGHT lower back pain that radiates into her right hip x 2 weeks. NKI. No sx. EXAM: LUMBAR SPINE - COMPLETE 4+ VIEW; DG HIP (WITH OR WITHOUT PELVIS) 2-3V RIGHT COMPARISON:  None. FINDINGS: Lumbar spine: Normal alignment. Vertebral body heights and intervertebral disc spaces are maintained. There is mild multilevel anterior spurring. No focal bone lesion. SI joints are open. Nonobstructive bowel gas pattern. Surgical clips are noted in the right upper quadrant. Right hip: Normal right hip alignment. There is no evidence of fracture or dislocation. There is no focal bone lesion. Hip joint spaces well maintained. There is no acute finding in the remainder of the pelvis. Regional soft tissues are unremarkable. IMPRESSION: No acute osseous abnormality in the lumbar spine or right hip. Electronically Signed   By: Audie Pinto M.D.   On: 09/06/2019 16:50    Procedures Procedures (including critical care time)  Medications Ordered in UC Medications - No data to display  Initial Impression / Assessment and Plan / UC Course  I have reviewed the triage vital signs and the nursing notes.  Pertinent labs & imaging results that were available during my care of the  patient were reviewed by me and considered in my medical decision making (see chart for details).     No red flag symptoms. Doubt cauda equina or spinal abscess.  Discussed limitations of plain films. Pt would like to proceed.   Reviewed imaging with pt. Encouraged f/u with PCP or Sports Medicine for further evaluation, possible MRI and/or PT referral. Offered muscle relaxers and prednisone, pt declined.  AVS provided.  Final Clinical Impressions(s) / UC Diagnoses   Final diagnoses:  Acute right-sided low back pain without sciatica  Right groin pain  Right hip pain     Discharge Instructions      You may take 500mg  acetaminophen every 4-6 hours or in combination with ibuprofen 400-600mg  every 6-8 hours as needed for pain and inflammation. You may also alternate cool and warm compresses.  Call to schedule a follow up appointment with family medicine or sports medicine for further evaluation and treatment of pain. You may benefit from a referral to PT.     ED Prescriptions    None     I have reviewed the PDMP during this encounter.   Noe Gens, [place] 09/07/19 [phone]

## 2019-09-06 NOTE — Discharge Instructions (Signed)
  You may take 500mg  acetaminophen every 4-6 hours or in combination with ibuprofen 400-600mg  every 6-8 hours as needed for pain and inflammation. You may also alternate cool and warm compresses.  Call to schedule a follow up appointment with family medicine or sports medicine for further evaluation and treatment of pain. You may benefit from a referral to PT.

## 2019-09-06 NOTE — ED Triage Notes (Signed)
Pain started approx 11 days ago after hiking and traveling to [place] Aching pain in right lower back -radiates to right groin Denies numbness or tingling  No OTC meds today Has tried salon pas patches - min relief w/ OTC

## 2019-12-06 ENCOUNTER — Other Ambulatory Visit: Payer: Self-pay | Admitting: Internal Medicine

## 2019-12-06 DIAGNOSIS — Z1231 Encounter for screening mammogram for malignant neoplasm of breast: Secondary | ICD-10-CM

## 2019-12-06 DIAGNOSIS — M549 Dorsalgia, unspecified: Secondary | ICD-10-CM

## 2019-12-10 DIAGNOSIS — U071 COVID-19: Secondary | ICD-10-CM

## 2019-12-10 HISTORY — DX: COVID-19: U07.1

## 2020-01-04 ENCOUNTER — Ambulatory Visit: Payer: 59

## 2020-01-04 ENCOUNTER — Ambulatory Visit
Admission: RE | Admit: 2020-01-04 | Discharge: 2020-01-04 | Disposition: A | Discharge status: Home / Self Care | Payer: 59 | Source: Ambulatory Visit | Attending: Internal Medicine | Admitting: Internal Medicine

## 2020-01-04 ENCOUNTER — Other Ambulatory Visit: Payer: Self-pay

## 2020-01-04 DIAGNOSIS — M549 Dorsalgia, unspecified: Secondary | ICD-10-CM

## 2020-01-04 DIAGNOSIS — Z1231 Encounter for screening mammogram for malignant neoplasm of breast: Secondary | ICD-10-CM

## 2020-01-05 ENCOUNTER — Other Ambulatory Visit: Payer: Self-pay | Admitting: Internal Medicine

## 2020-01-05 DIAGNOSIS — R928 Other abnormal and inconclusive findings on diagnostic imaging of breast: Secondary | ICD-10-CM

## 2020-01-19 ENCOUNTER — Ambulatory Visit: Payer: 59

## 2020-01-19 ENCOUNTER — Ambulatory Visit
Admission: RE | Admit: 2020-01-19 | Discharge: 2020-01-19 | Disposition: A | Discharge status: Home / Self Care | Payer: 59 | Source: Ambulatory Visit | Attending: Internal Medicine | Admitting: Internal Medicine

## 2020-01-19 ENCOUNTER — Other Ambulatory Visit: Payer: Self-pay

## 2020-01-19 DIAGNOSIS — R928 Other abnormal and inconclusive findings on diagnostic imaging of breast: Secondary | ICD-10-CM

## 2020-03-09 ENCOUNTER — Ambulatory Visit (AMBULATORY_SURGERY_CENTER): Payer: Self-pay | Admitting: *Deleted

## 2020-03-09 ENCOUNTER — Encounter: Payer: Self-pay | Admitting: Internal Medicine

## 2020-03-09 ENCOUNTER — Other Ambulatory Visit: Payer: Self-pay

## 2020-03-09 VITALS — Ht 65.0 in | Wt 236.0 lb

## 2020-03-09 DIAGNOSIS — Z1211 Encounter for screening for malignant neoplasm of colon: Secondary | ICD-10-CM

## 2020-03-09 MED ORDER — SUTAB 1479-225-188 MG PO TABS: 24.0000 | ORAL_TABLET | ORAL | 0 refills | Status: DC

## 2020-03-09 NOTE — Progress Notes (Signed)
No egg or soy allergy known to patient   issues with past sedation with any surgeries or procedures- PONV  no intubation problems in the past  No FH of Malignant Hyperthermia No diet pills per patient No home 02 use per patient  No blood thinners per patient  Pt denies issues with constipation  No A fib or A flutter  EMMI video to pt or via [place] 19 guidelines implemented in PV today with Pt and RN   Cov vax x 2- infection Covid 11-2019  [place]  Coupon given to pt in PV today , Code to Pharmacy   Due to the COVID-19 pandemic we are asking patients to follow these guidelines. Please only bring one care partner. Please be aware that your care partner may wait in the car in the parking lot or if they feel like they will be too hot to wait in the car, they may wait in the lobby on the 4th floor. All care partners are required to wear a mask the entire time (we do not have any that we can provide them), they need to practice social distancing, and we will do a Covid check for all patient's and care partners when you arrive. Also we will check their temperature and your temperature. If the care partner waits in their car they need to stay in the parking lot the entire time and we will call them on their cell phone when the patient is ready for discharge so they can bring the car to the front of the building. Also all patient's will need to wear a mask into building.

## 2020-03-20 ENCOUNTER — Encounter: Payer: Self-pay | Admitting: Internal Medicine

## 2020-03-20 ENCOUNTER — Ambulatory Visit (AMBULATORY_SURGERY_CENTER): Payer: 59 | Admitting: Internal Medicine

## 2020-03-20 ENCOUNTER — Other Ambulatory Visit: Payer: Self-pay

## 2020-03-20 VITALS — BP 118/77 | HR 65 | Temp 98.1°F | Resp 12 | Ht 65.0 in | Wt 236.0 lb

## 2020-03-20 DIAGNOSIS — Z1211 Encounter for screening for malignant neoplasm of colon: Secondary | ICD-10-CM | POA: Diagnosis present

## 2020-03-20 MED ORDER — SODIUM CHLORIDE 0.9 % IV SOLN: 500.0000 mL | Freq: Once | INTRAVENOUS | Status: DC

## 2020-03-20 NOTE — Progress Notes (Signed)
Vs by N campbell,LPN  Pt's states no medical or surgical changes since previsit or office visit.

## 2020-03-20 NOTE — Progress Notes (Signed)
PT taken to PACU. Monitors in place. VSS. Report given to RN. 

## 2020-03-20 NOTE — Patient Instructions (Signed)
Handouts given:  Diverticulosis, Hemorrhoids  Resume previous diet  continue  current medications Repeat colonoscopy in 10 years  YOU HAD AN ENDOSCOPIC PROCEDURE TODAY AT [facility]:   Refer to the procedure report that was given to you for any specific questions about what was found during the examination.  If the procedure report does not answer your questions, please call your gastroenterologist to clarify.  If you requested that your care partner not be given the details of your procedure findings, then the procedure report has been included in a sealed envelope for you to review at your convenience later.  YOU SHOULD EXPECT: Some feelings of bloating in the abdomen. Passage of more gas than usual.  Walking can help get rid of the air that was put into your GI tract during the procedure and reduce the bloating. If you had a lower endoscopy (such as a colonoscopy or flexible sigmoidoscopy) you may notice spotting of blood in your stool or on the toilet paper. If you underwent a bowel prep for your procedure, you may not have a normal bowel movement for a few days.  Please Note:  You might notice some irritation and congestion in your nose or some drainage.  This is from the oxygen used during your procedure.  There is no need for concern and it should clear up in a day or so.  SYMPTOMS TO REPORT IMMEDIATELY:   Following lower endoscopy (colonoscopy or flexible sigmoidoscopy):  Excessive amounts of blood in the stool  Significant tenderness or worsening of abdominal pains  Swelling of the abdomen that is new, acute  Fever of 100F or higher For urgent or emergent issues, a gastroenterologist can be reached at any hour by calling [phone]. Do not use MyChart messaging for urgent concerns.   DIET:  We do recommend a small meal at first, but then you may proceed to your regular diet.  Drink plenty of fluids but you should avoid alcoholic beverages for 24  hours.  ACTIVITY:  You should plan to take it easy for the rest of today and you should NOT DRIVE or use heavy machinery until tomorrow (because of the sedation medicines used during the test).    FOLLOW UP: Our staff will call the number listed on your records 48-72 hours following your procedure to check on you and address any questions or concerns that you may have regarding the information given to you following your procedure. If we do not reach you, we will leave a message.  We will attempt to reach you two times.  During this call, we will ask if you have developed any symptoms of COVID 19. If you develop any symptoms (ie: fever, flu-like symptoms, shortness of breath, cough etc.) before then, please call [phone].  If you test positive for Covid 19 in the 2 weeks post procedure, please call and report this information to [place].    If any biopsies were taken you will be contacted by phone or by letter within the next 1-3 weeks.  Please call us at [phone] if you have not heard about the biopsies in 3 weeks.    SIGNATURES/CONFIDENTIALITY: You and/or your care partner have signed paperwork which will be entered into your electronic medical record.  These signatures attest to the fact that that the information above on your After Visit Summary has been reviewed and is understood.  Full responsibility of the confidentiality of this discharge information lies with you and/or your care-partner.

## 2020-03-20 NOTE — Op Note (Signed)
[facility] Patient Name: Rebecca Price Procedure Date: 03/20/2020 10:09 AM MRN: [id number] Endoscopist: Jerene Bears , MD Age: 54 Referring MD:  Date of Birth: 01/21/1966 Gender: Female Account #: [id number] Procedure:                Colonoscopy Indications:              Screening for colorectal malignant neoplasm, This                            is the patient's first colonoscopy Medicines:                Monitored Anesthesia Care Procedure:                Pre-Anesthesia Assessment:                           - Prior to the procedure, a History and Physical                            was performed, and patient medications and                            allergies were reviewed. The patient's tolerance of                            previous anesthesia was also reviewed. The risks                            and benefits of the procedure and the sedation                            options and risks were discussed with the patient.                            All questions were answered, and informed consent                            was obtained. Prior Anticoagulants: The patient has                            taken no previous anticoagulant or antiplatelet                            agents. ASA Grade Assessment: II - A patient with                            mild systemic disease. After reviewing the risks                            and benefits, the patient was deemed in                            satisfactory condition to undergo the procedure.  After obtaining informed consent, the colonoscope                            was passed under direct vision. Throughout the                            procedure, the patient's blood pressure, pulse, and                            oxygen saturations were monitored continuously. The                            Colonoscope was introduced through the anus and                            advanced to the cecum,  identified by appendiceal                            orifice and ileocecal valve. The colonoscopy was                            performed without difficulty. The patient tolerated                            the procedure well. The quality of the bowel                            preparation was good. The ileocecal valve,                            appendiceal orifice, and rectum were photographed. Scope In: 10:23:25 AM Scope Out: 10:36:41 AM Scope Withdrawal Time: 0 hours 11 minutes 2 seconds  Total Procedure Duration: 0 hours 13 minutes 16 seconds  Findings:                 Skin tags were found on perianal exam.                           Multiple small-mouthed diverticula were found in                            the sigmoid colon.                           Internal hemorrhoids were found during retroflexion. Complications:            No immediate complications. Estimated Blood Loss:     Estimated blood loss: none. Impression:               - Diverticulosis in the sigmoid colon.                           - Internal hemorrhoids and perianal skin tags                            (small).                           -  No specimens collected. Recommendation:           - Patient has a contact number available for                            emergencies. The signs and symptoms of potential                            delayed complications were discussed with the                            patient. Return to normal activities tomorrow.                            Written discharge instructions were provided to the                            patient.                           - Resume previous diet.                           - Continue present medications.                           - Repeat colonoscopy in 10 years for screening                            purposes. Jerene Bears, MD 03/20/2020 10:38:54 AM This report has been signed electronically.

## 2020-03-22 ENCOUNTER — Encounter: Payer: 59 | Admitting: Internal Medicine

## 2020-03-22 ENCOUNTER — Telehealth: Payer: Self-pay | Admitting: *Deleted

## 2020-03-22 NOTE — Telephone Encounter (Signed)
  Follow up Call-  Call back number 03/20/2020  Post procedure Call Back phone  # [phone]  Permission to leave phone message Yes  Some recent data might be hidden     Patient questions:  Do you have a fever, pain , or abdominal swelling? No. Pain Score  0 *  Have you tolerated food without any problems? Yes.    Have you been able to return to your normal activities? Yes.    Do you have any questions about your discharge instructions: Diet   No. Medications  No. Follow up visit  no  Do you have questions or concerns about your Care? No.  Actions: * If pain score is 4 or above: 1. No action needed, pain <4.Have you developed a fever since your procedure? no  2.   Have you had an respiratory symptoms (SOB or cough) since your procedure? no  3.   Have you tested positive for COVID 19 since your procedure no  4.   Have you had any family members/close contacts diagnosed with the COVID 19 since your procedure?  no   If yes to any of these questions please route to Joylene John, RN and Joella Prince, RN

## 2020-03-22 NOTE — Telephone Encounter (Signed)
Follow up call made. Left message

## 2021-02-15 ENCOUNTER — Other Ambulatory Visit: Payer: Self-pay | Admitting: Internal Medicine

## 2021-02-15 DIAGNOSIS — Z1231 Encounter for screening mammogram for malignant neoplasm of breast: Secondary | ICD-10-CM

## 2021-03-15 ENCOUNTER — Ambulatory Visit
Admission: RE | Admit: 2021-03-15 | Discharge: 2021-03-15 | Disposition: A | Discharge status: Home / Self Care | Payer: 59 | Source: Ambulatory Visit

## 2021-03-15 ENCOUNTER — Other Ambulatory Visit: Payer: Self-pay

## 2021-03-15 DIAGNOSIS — Z1231 Encounter for screening mammogram for malignant neoplasm of breast: Secondary | ICD-10-CM

## 2021-03-19 ENCOUNTER — Other Ambulatory Visit: Payer: Self-pay | Admitting: Internal Medicine

## 2021-03-19 DIAGNOSIS — R928 Other abnormal and inconclusive findings on diagnostic imaging of breast: Secondary | ICD-10-CM

## 2021-03-30 ENCOUNTER — Ambulatory Visit
Admission: RE | Admit: 2021-03-30 | Discharge: 2021-03-30 | Disposition: A | Discharge status: Home / Self Care | Payer: 59 | Source: Ambulatory Visit | Attending: Internal Medicine | Admitting: Internal Medicine

## 2021-03-30 ENCOUNTER — Ambulatory Visit: Payer: 59

## 2021-03-30 DIAGNOSIS — R928 Other abnormal and inconclusive findings on diagnostic imaging of breast: Secondary | ICD-10-CM

## 2022-02-20 ENCOUNTER — Other Ambulatory Visit: Payer: Self-pay | Admitting: Specialist

## 2022-02-20 DIAGNOSIS — Z1231 Encounter for screening mammogram for malignant neoplasm of breast: Secondary | ICD-10-CM

## 2022-03-29 ENCOUNTER — Ambulatory Visit
Admission: RE | Admit: 2022-03-29 | Discharge: 2022-03-29 | Disposition: A | Discharge status: Home / Self Care | Payer: 59 | Source: Ambulatory Visit

## 2022-03-29 DIAGNOSIS — Z1231 Encounter for screening mammogram for malignant neoplasm of breast: Secondary | ICD-10-CM

## 2023-05-12 ENCOUNTER — Other Ambulatory Visit: Payer: Self-pay | Admitting: Internal Medicine

## 2023-05-12 DIAGNOSIS — Z1231 Encounter for screening mammogram for malignant neoplasm of breast: Secondary | ICD-10-CM

## 2023-05-26 DIAGNOSIS — Z1231 Encounter for screening mammogram for malignant neoplasm of breast: Secondary | ICD-10-CM

## 2023-06-12 ENCOUNTER — Ambulatory Visit
Admission: RE | Admit: 2023-06-12 | Discharge: 2023-06-12 | Disposition: A | Discharge status: Home / Self Care | Payer: No Typology Code available for payment source | Source: Ambulatory Visit | Attending: Internal Medicine | Admitting: Internal Medicine

## 2023-06-12 DIAGNOSIS — Z1231 Encounter for screening mammogram for malignant neoplasm of breast: Secondary | ICD-10-CM

## 2023-07-20 IMAGING — MG MM DIGITAL DIAGNOSTIC UNILAT*R* W/ TOMO W/ CAD
6 of 10 series · 6 of 30 positions shown · non-contrast
Comparison: Previous exam(s).

CLINICAL DATA: 55-year-old female recalled from screening mammogram
dated 03/15/2021 for a possible right breast asymmetry.

EXAM:
DIGITAL DIAGNOSTIC UNILATERAL RIGHT MAMMOGRAM WITH TOMOSYNTHESIS AND
CAD
TECHNIQUE: Right digital diagnostic mammography and breast tomosynthesis was
performed. The images were evaluated with computer-aided detection.

[R ML synth-2D]
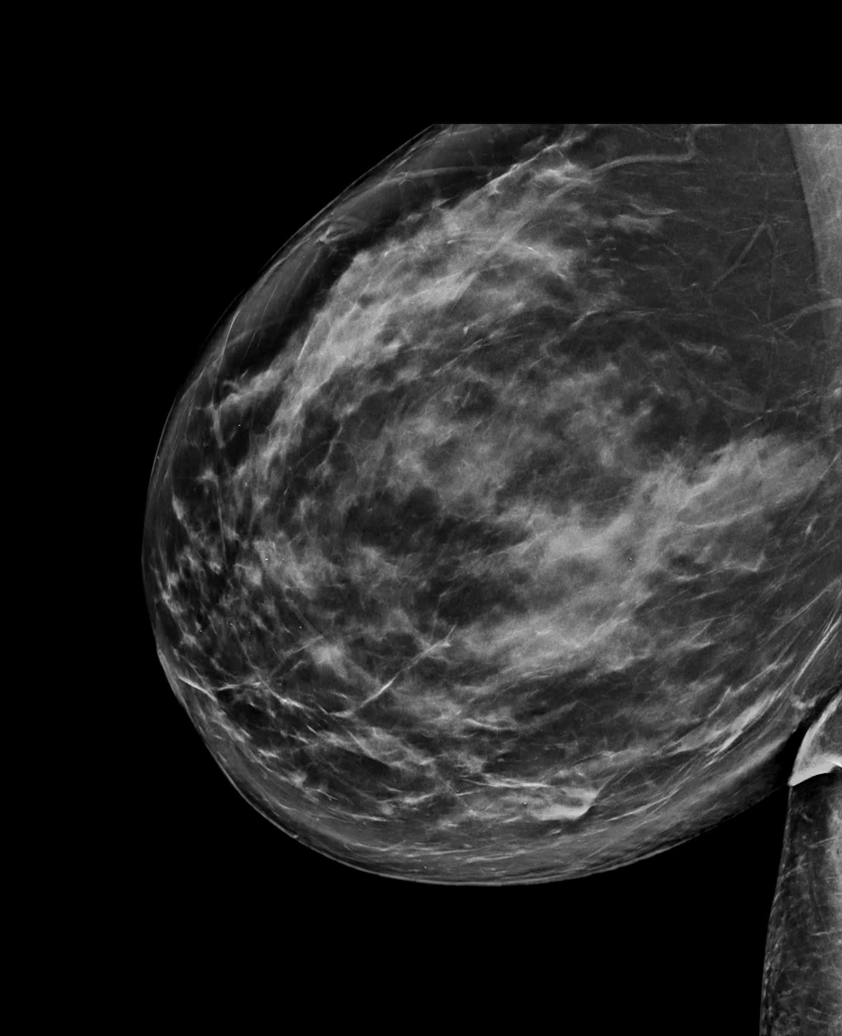

[R CC synth-2D (1 of 4)]
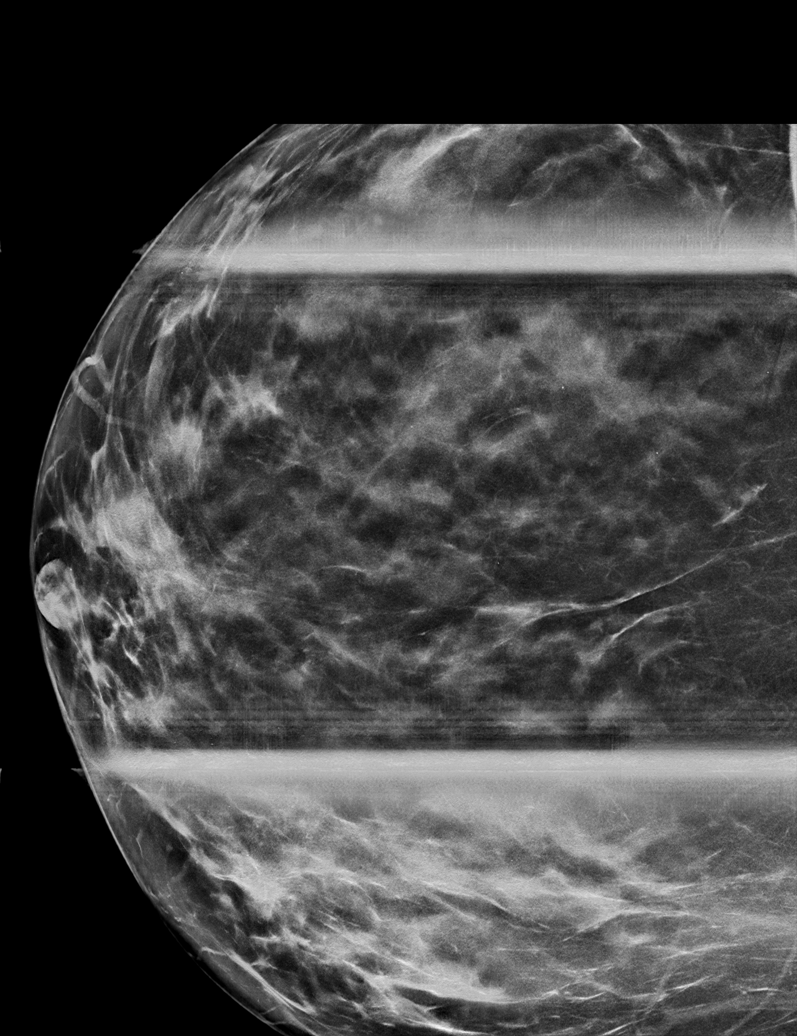

[R CC synth-2D (2 of 4)]
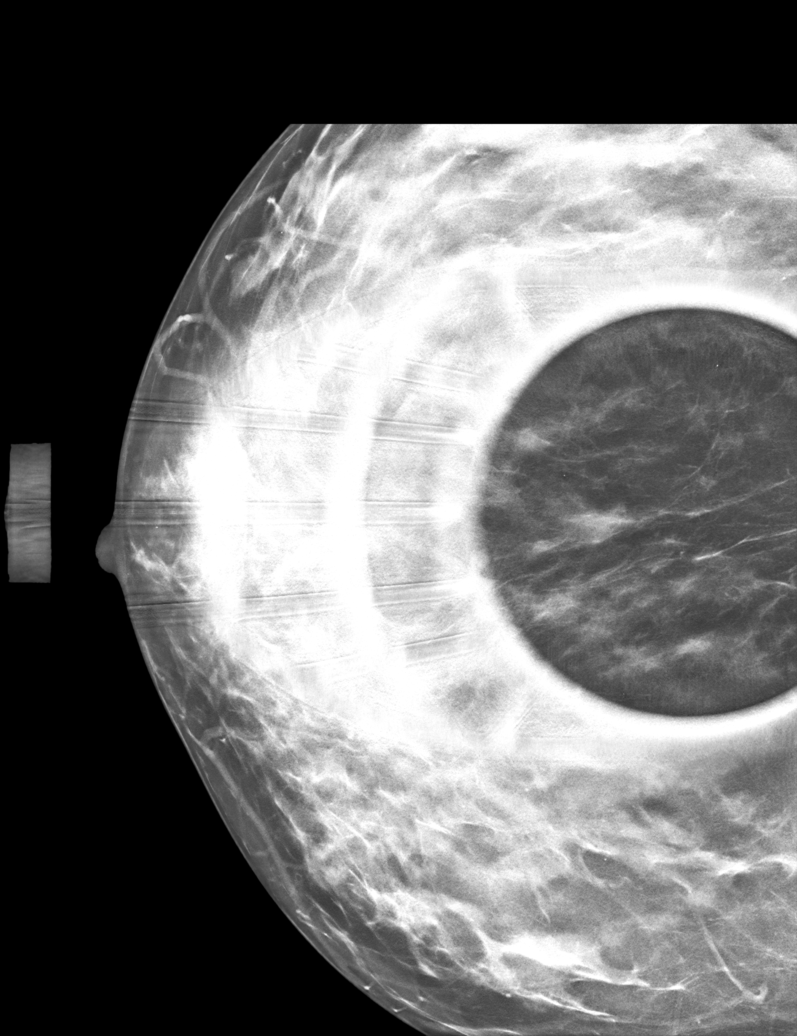

[R CC synth-2D (3 of 4)]
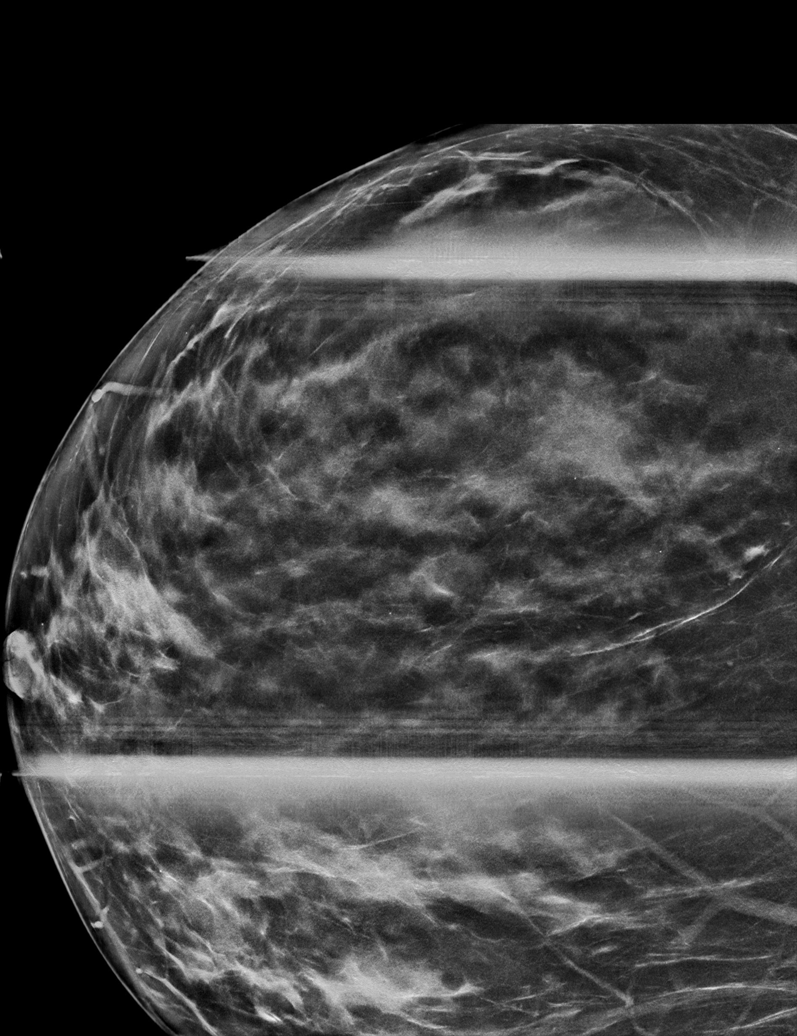

[R CC synth-2D (4 of 4)]
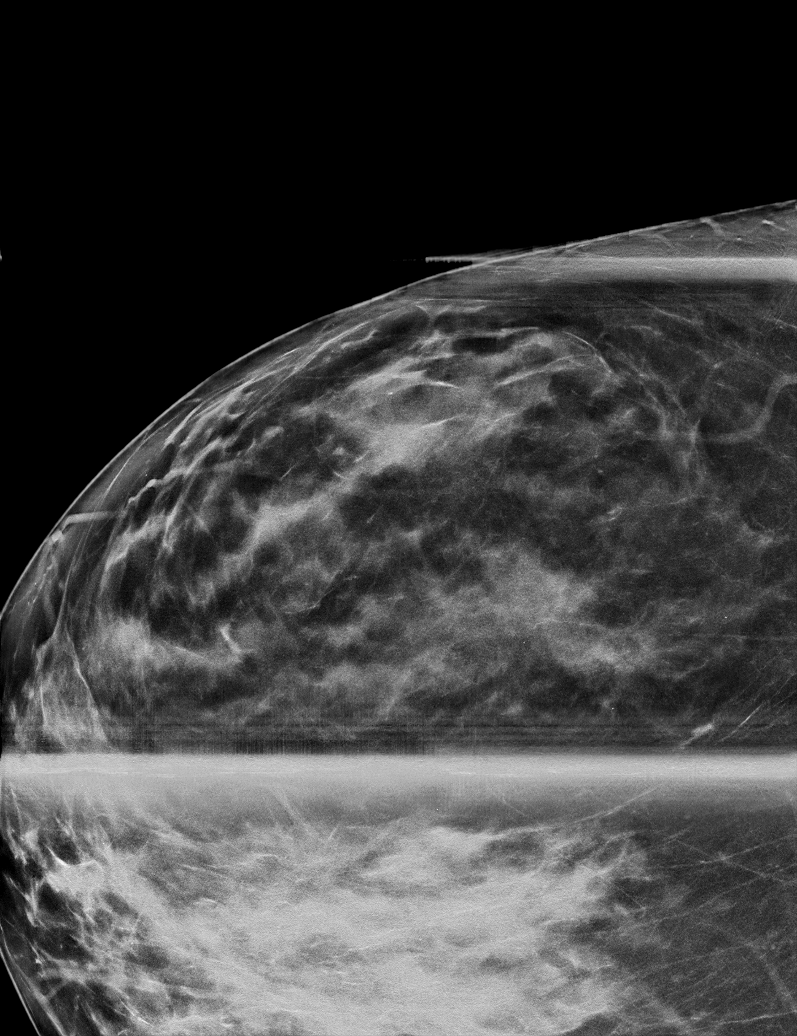

[R CC tomo · tomo slice 37/72.0]
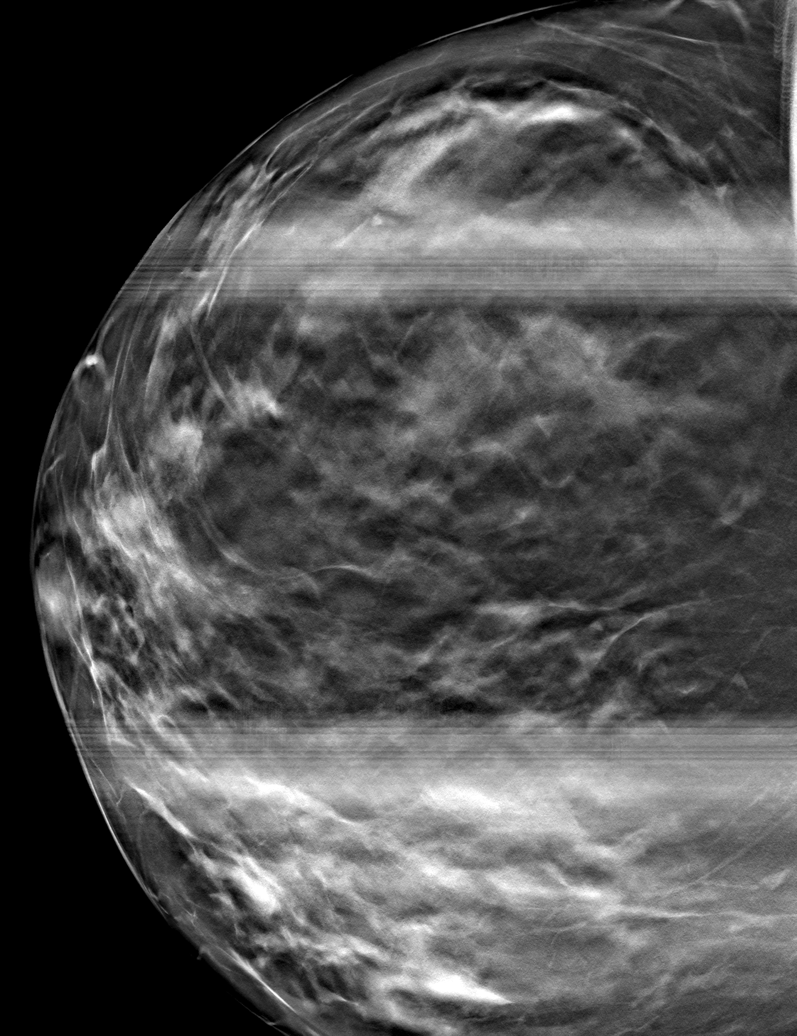

[6 of 30 positions shown; findings below may reference images not displayed]

ACR Breast Density Category c: The breast tissue is heterogeneously
dense, which may obscure small masses.
FINDINGS: Previously described, possible asymmetry in the lateral right breast
is seen on the cc projection only resolves into well dispersed
fibroglandular tissue on today's additional views. No suspicious
findings are identified.
IMPRESSION: No mammographic evidence of malignancy.

RECOMMENDATION:
Screening mammogram in one year.(Code:[id number])

I have discussed the findings and recommendations with the patient.
If applicable, a reminder letter will be sent to the patient
regarding the next appointment.

BI-RADS CATEGORY  1: Negative.
